# Patient Record
Sex: Female | Born: 1970 | Race: White | Hispanic: No | Marital: Married | Smoking: Current every day smoker
Health system: Southern US, Community
[De-identification: ages and names within clinical notes are randomized; demographics above are authoritative.]

## PROBLEM LIST (undated history)

## (undated) DIAGNOSIS — F419 Anxiety disorder, unspecified: Secondary | ICD-10-CM

## (undated) HISTORY — DX: Anxiety disorder, unspecified: F41.9

## (undated) HISTORY — PX: CHOLECYSTECTOMY: SHX55

## (undated) HISTORY — PX: ENDOMETRIAL ABLATION: SHX621

---

## 1998-02-05 ENCOUNTER — Other Ambulatory Visit: Admission: RE | Admit: 1998-02-05 | Discharge: 1998-02-05 | Payer: Self-pay | Admitting: Gynecology

## 1998-02-18 ENCOUNTER — Ambulatory Visit (HOSPITAL_COMMUNITY): Admission: AD | Admit: 1998-02-18 | Discharge: 1998-02-18 | Payer: Self-pay | Admitting: Gynecology

## 1998-10-16 ENCOUNTER — Other Ambulatory Visit: Admission: RE | Admit: 1998-10-16 | Discharge: 1998-10-16 | Payer: Self-pay | Admitting: Gynecology

## 1999-01-22 ENCOUNTER — Encounter: Admission: RE | Admit: 1999-01-22 | Discharge: 1999-04-22 | Payer: Self-pay | Admitting: Gynecology

## 1999-04-25 ENCOUNTER — Inpatient Hospital Stay (HOSPITAL_COMMUNITY): Admission: AD | Admit: 1999-04-25 | Discharge: 1999-04-25 | Payer: Self-pay | Admitting: Gynecology

## 1999-04-30 ENCOUNTER — Inpatient Hospital Stay (HOSPITAL_COMMUNITY): Admission: AD | Admit: 1999-04-30 | Discharge: 1999-04-30 | Payer: Self-pay | Admitting: Gynecology

## 1999-05-10 ENCOUNTER — Inpatient Hospital Stay (HOSPITAL_COMMUNITY): Admission: AD | Admit: 1999-05-10 | Discharge: 1999-05-12 | Payer: Self-pay | Admitting: Gynecology

## 1999-06-14 ENCOUNTER — Other Ambulatory Visit: Admission: RE | Admit: 1999-06-14 | Discharge: 1999-06-14 | Payer: Self-pay | Admitting: Gynecology

## 2001-01-28 ENCOUNTER — Other Ambulatory Visit: Admission: RE | Admit: 2001-01-28 | Discharge: 2001-01-28 | Payer: Self-pay | Admitting: Gynecology

## 2002-01-20 ENCOUNTER — Encounter: Payer: Self-pay | Admitting: Emergency Medicine

## 2002-01-20 ENCOUNTER — Emergency Department (HOSPITAL_COMMUNITY): Admission: EM | Admit: 2002-01-20 | Discharge: 2002-01-20 | Payer: Self-pay | Admitting: Emergency Medicine

## 2002-09-14 ENCOUNTER — Other Ambulatory Visit: Admission: RE | Admit: 2002-09-14 | Discharge: 2002-09-14 | Payer: Self-pay | Admitting: *Deleted

## 2005-02-12 ENCOUNTER — Other Ambulatory Visit: Admission: RE | Admit: 2005-02-12 | Discharge: 2005-02-12 | Payer: Self-pay | Admitting: Obstetrics and Gynecology

## 2017-03-16 ENCOUNTER — Emergency Department (HOSPITAL_COMMUNITY)
Admission: EM | Admit: 2017-03-16 | Discharge: 2017-03-17 | Disposition: A | Discharge status: Home / Self Care | Payer: 59 | Attending: Emergency Medicine | Admitting: Emergency Medicine

## 2017-03-16 ENCOUNTER — Encounter (HOSPITAL_COMMUNITY): Payer: Self-pay

## 2017-03-16 DIAGNOSIS — R42 Dizziness and giddiness: Secondary | ICD-10-CM | POA: Diagnosis not present

## 2017-03-16 DIAGNOSIS — R109 Unspecified abdominal pain: Secondary | ICD-10-CM | POA: Diagnosis not present

## 2017-03-16 DIAGNOSIS — M545 Low back pain, unspecified: Secondary | ICD-10-CM

## 2017-03-16 DIAGNOSIS — R112 Nausea with vomiting, unspecified: Secondary | ICD-10-CM | POA: Insufficient documentation

## 2017-03-16 DIAGNOSIS — R51 Headache: Secondary | ICD-10-CM | POA: Insufficient documentation

## 2017-03-16 DIAGNOSIS — R197 Diarrhea, unspecified: Secondary | ICD-10-CM | POA: Diagnosis not present

## 2017-03-16 DIAGNOSIS — R350 Frequency of micturition: Secondary | ICD-10-CM | POA: Diagnosis not present

## 2017-03-16 LAB — URINALYSIS, ROUTINE W REFLEX MICROSCOPIC
BILIRUBIN URINE: NEGATIVE
Glucose, UA: NEGATIVE mg/dL
KETONES UR: 5 mg/dL — AB
LEUKOCYTES UA: NEGATIVE
NITRITE: NEGATIVE
Protein, ur: NEGATIVE mg/dL
SPECIFIC GRAVITY, URINE: 1.016 (ref 1.005–1.030)
pH: 5 (ref 5.0–8.0)

## 2017-03-16 NOTE — ED Triage Notes (Signed)
Pt complains of flank pain for one week, she has been treated for a UTI by her primary doctor, Friday she started to vomit and her skin was red and burning, she hasn't had her antibiotic in the last two doses

## 2017-03-16 NOTE — ED Provider Notes (Signed)
TIME SEEN: 11:57 PM  CHIEF COMPLAINT: "I think I have a urinary tract infection"  HPI: Patient is a 46 year old 72female with no significant past medical history who presents to the emergency department with complaints of lower back pain and urinary frequency that has been present for almost 2 weeks.  Was seen in urgent care and had a normal urinalysis in the positive urine culture.  Was started on Bactrim for 3 days.  Symptoms improved but did not resolve.  Was seen again and started back on Bactrim for another 10 days.  Reports over the weekend she developed itchy skin without rash, felt hot and lightheaded, had mild headache, felt nauseated and had some vomiting, felt flushed.  She stopped taking these antibiotics 2 days ago.  Denies any fevers, abdominal pain.  Has had diarrhea.  Still complaining of lower back pain that goes up into her flank.  No injury to her back.  Pain is worse with movement.  No numbness, tingling or focal weakness.  No bowel or bladder incontinence.  No history of kidney stone.  States she was told by the urgent care provider that she had a "touch of sepsis".  She denies any blood work was drawn at the urgent care facility.  ROS: See HPI Constitutional: no fever  Eyes: no drainage  ENT: no runny nose   Cardiovascular:  no chest pain  Resp: no SOB  GI: Diarrhea and intermittent vomiting GU: no dysuria Integumentary: no rash  Allergy: no hives  Musculoskeletal: no leg swelling  Neurological: no slurred speech ROS otherwise negative  PAST MEDICAL HISTORY/PAST SURGICAL HISTORY:  History reviewed. No pertinent past medical history.  MEDICATIONS:  Prior to Admission medications   Medication Sig Start Date End Date Taking? Authorizing Provider  ibuprofen (ADVIL,MOTRIN) 200 MG tablet Take 400 mg by mouth every 6 (six) hours as needed for moderate pain.   Yes [provider]  sulfamethoxazole-trimethoprim (BACTRIM DS,SEPTRA DS) 800-160 MG tablet Take 1 tablet by  mouth 2 (two) times daily. for 10 days 03/11/17  Yes [provider]    ALLERGIES:  No Known Allergies  SOCIAL HISTORY:  Social History  Substance Use Topics  . Smoking status: Never Smoker  . Smokeless tobacco: Never Used  . Alcohol use No    FAMILY HISTORY: History reviewed. No pertinent family history.  EXAM: BP 129/86 (BP Location: Left Arm)   Pulse 75   Temp 98 F (36.7 C) (Oral)   Resp 16   Ht 5\' 1"  (1.549 m)   Wt 79.2 kg (174 lb 11.2 oz)   SpO2 100%   BMI 33.01 kg/m  CONSTITUTIONAL: Alert and oriented and responds appropriately to questions. Well-appearing; well-nourished HEAD: Normocephalic EYES: Conjunctivae clear, pupils appear equal, EOMI ENT: normal nose; moist mucous membranes; No pharyngeal erythema or petechiae, no tonsillar hypertrophy or exudate, no uvular deviation, no unilateral swelling, no trismus or drooling, no muffled voice, normal phonation, no stridor, no dental caries present, no drainable dental abscess noted, no Ludwig's angina, tongue sits flat in the bottom of the mouth, no angioedema, no facial erythema or warmth, no facial swelling; no pain with movement of the neck. NECK: Supple, no meningismus, no nuchal rigidity, no LAD  CARD: RRR; S1 and S2 appreciated; no murmurs, no clicks, no rubs, no gallops RESP: Normal chest excursion without splinting or tachypnea; breath sounds clear and equal bilaterally; no wheezes, no rhonchi, no rales, no hypoxia or respiratory distress, speaking full sentences ABD/GI: Normal bowel sounds; non-distended; soft,  non-tender, no rebound, no guarding, no peritoneal signs, no hepatosplenomegaly BACK:  The back appears normal and is non-tender to palpation, there is no CVA tenderness, patient points to the lower lumbar spine as the source of her pain bilaterally, no midline step-off or deformity EXT: Normal ROM in all joints; non-tender to palpation; no edema; normal capillary refill; no cyanosis, no calf  tenderness or swelling    SKIN: Normal color for age and race; warm; no rash NEURO: Moves all extremities equally, sensation to light touch intact diffusely, normal speech, normal gait, no saddle anesthesia PSYCH: The patient's mood and manner are appropriate. Grooming and personal hygiene are appropriate.  MEDICAL DECISION MAKING: Patient here with complaints of back pain that goes into the flank.  Also having some urinary frequency.  Urine here shows no obvious sign of infection but will send a urine culture.  Patient seems to have musculoskeletal back pain but this also could be kidney stone, pyelonephritis.  States she was told she had "a touch of sepsis".  I am not sure what this means.  Her vital signs today are normal.  She is afebrile and has not had any antipyretics today.  Will check labs, lactate.  Will give IV fluids, Toradol.  She reports nausea has improved.  I suspect some of her symptoms over the weekend may have been an adverse reaction to Bactrim.  I recommended she stop this medication.  ED PROGRESS: Patient's workup is unremarkable.  No leukocytosis.  Normal lactate.  Normal LFTs and lipase.  CT scan shows complex renal cyst.  Ultrasound recommended but I feel this can be done as an outpatient.  No kidney stones or signs of pyelonephritis.  Urine culture has been sent.  I suspect this is more musculoskeletal pain.  Recommended holding Bactrim and alternating Tylenol and Motrin for pain.  She has an appointment for follow-up with her primary care physician at 9:30 AM today.   At this time, I do not feel there is any life-threatening condition present. I have reviewed and discussed all results (EKG, imaging, lab, urine as appropriate) and exam findings with patient/family. I have reviewed nursing notes and appropriate previous records.  I feel the patient is safe to be discharged home without further emergent workup and can continue workup as an outpatient as needed. Discussed usual and  customary return precautions. Patient/family verbalize understanding and are comfortable with this plan.  Outpatient follow-up has been provided if needed. All questions have been answered.      Alaija Ruble, Layla Maw, DO 03/17/17 0120

## 2017-03-17 ENCOUNTER — Emergency Department (HOSPITAL_COMMUNITY): Payer: 59

## 2017-03-17 LAB — CBC WITH DIFFERENTIAL/PLATELET
Basophils Absolute: 0.1 10*3/uL (ref 0.0–0.1)
Basophils Relative: 1 %
EOS PCT: 2 %
Eosinophils Absolute: 0.2 10*3/uL (ref 0.0–0.7)
HCT: 46.7 % — ABNORMAL HIGH (ref 36.0–46.0)
HEMOGLOBIN: 16.7 g/dL — AB (ref 12.0–15.0)
LYMPHS ABS: 3.7 10*3/uL (ref 0.7–4.0)
Lymphocytes Relative: 45 %
MCH: 31.5 pg (ref 26.0–34.0)
MCHC: 35.8 g/dL (ref 30.0–36.0)
MCV: 87.9 fL (ref 78.0–100.0)
MONOS PCT: 6 %
Monocytes Absolute: 0.5 10*3/uL (ref 0.1–1.0)
NEUTROS PCT: 46 %
Neutro Abs: 3.8 10*3/uL (ref 1.7–7.7)
Platelets: 173 10*3/uL (ref 150–400)
RBC: 5.31 MIL/uL — ABNORMAL HIGH (ref 3.87–5.11)
RDW: 12.9 % (ref 11.5–15.5)
WBC: 8.2 10*3/uL (ref 4.0–10.5)

## 2017-03-17 LAB — COMPREHENSIVE METABOLIC PANEL
ALK PHOS: 64 U/L (ref 38–126)
ALT: 23 U/L (ref 14–54)
ANION GAP: 8 (ref 5–15)
AST: 19 U/L (ref 15–41)
Albumin: 4.7 g/dL (ref 3.5–5.0)
BUN: 14 mg/dL (ref 6–20)
CALCIUM: 9.4 mg/dL (ref 8.9–10.3)
CO2: 22 mmol/L (ref 22–32)
Chloride: 107 mmol/L (ref 101–111)
Creatinine, Ser: 1 mg/dL (ref 0.44–1.00)
Glucose, Bld: 100 mg/dL — ABNORMAL HIGH (ref 65–99)
Potassium: 3.9 mmol/L (ref 3.5–5.1)
SODIUM: 137 mmol/L (ref 135–145)
TOTAL PROTEIN: 7.7 g/dL (ref 6.5–8.1)
Total Bilirubin: 0.7 mg/dL (ref 0.3–1.2)

## 2017-03-17 LAB — I-STAT CG4 LACTIC ACID, ED: LACTIC ACID, VENOUS: 1.08 mmol/L (ref 0.5–1.9)

## 2017-03-17 LAB — LIPASE, BLOOD: LIPASE: 30 U/L (ref 11–51)

## 2017-03-17 MED ORDER — SODIUM CHLORIDE 0.9 % IV BOLUS (SEPSIS)
1000.0000 mL | Freq: Once | INTRAVENOUS | Status: AC
  Administered 2017-03-17: 1000 mL via INTRAVENOUS

## 2017-03-17 MED ORDER — KETOROLAC TROMETHAMINE 30 MG/ML IJ SOLN
30.0000 mg | Freq: Once | INTRAMUSCULAR | Status: AC
  Administered 2017-03-17: 30 mg via INTRAVENOUS
  Filled 2017-03-17: qty 1

## 2017-03-17 NOTE — Discharge Instructions (Signed)
Your blood work, urine, CT scan were reassuring today.  You had what appeared to be cyst on your kidneys which would not cause you to have any pain but radiology has recommended further evaluation with an ultrasound.  This can be ordered by her primary care doctor as an outpatient.  I suspect that this is musculoskeletal back pain.  May alternate Tylenol and Motrin as needed for pain.  You may apply heat to this area.  Please do not lift anything over 20 pounds.  Please no strenuous activity for the next week.   You may alternate Tylenol 1000 mg every 6 hours as needed for pain and Ibuprofen 800 mg every 8 hours as needed for pain.  Please take Ibuprofen with food.

## 2017-03-17 NOTE — ED Notes (Signed)
Patient transported to X-ray 

## 2017-03-18 LAB — URINE CULTURE

## 2018-06-18 IMAGING — CT CT RENAL STONE PROTOCOL
2 of 3 series · 16 of 46 positions shown, 18 images · non-contrast
Comparison: Abdominal CT dated 07/31/2008

CLINICAL DATA: 46-year-old female with flank pain. Concern for
kidney stone.

EXAM:
CT ABDOMEN AND PELVIS WITHOUT CONTRAST
TECHNIQUE: Multidetector CT imaging of the abdomen and pelvis was performed
following the standard protocol without IV contrast.

[Series 3: lung · axial · 0.67mm/px · z∈[-373,-309]mm · 13 of 38 slices shown, 15 images]
[im 3/38  soft-tissue]
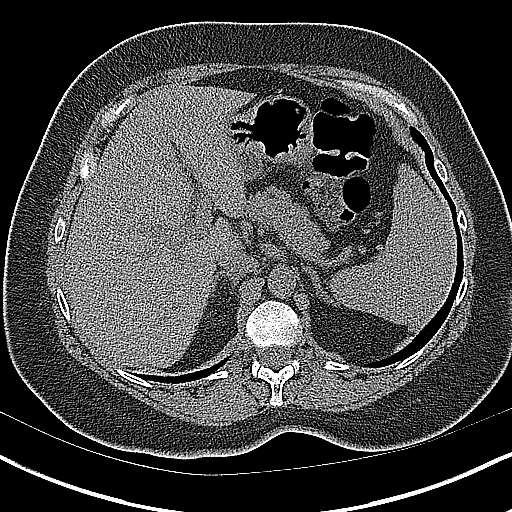
[im 3/38  bone]
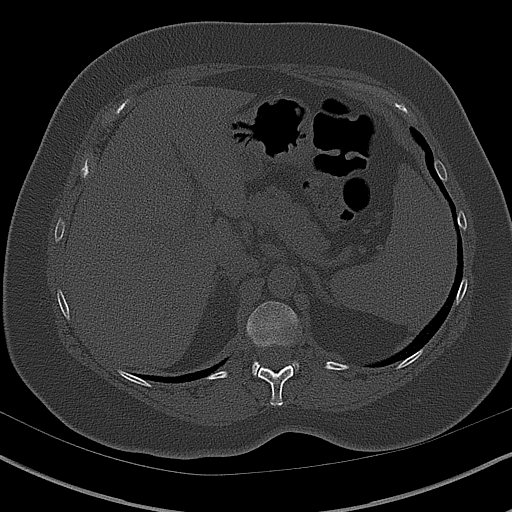
[im 5/38  soft-tissue]
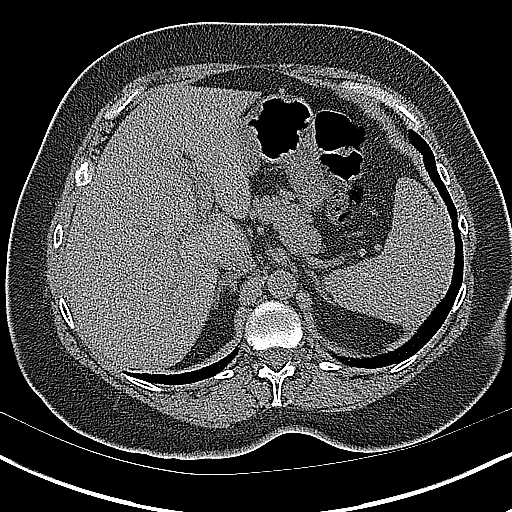
[im 8/38  soft-tissue]
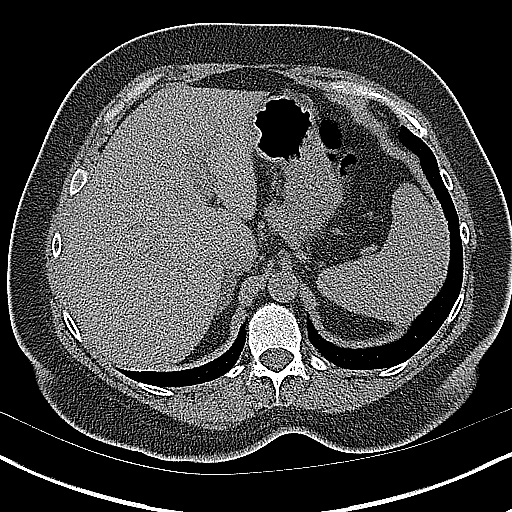
[im 11/38  soft-tissue]
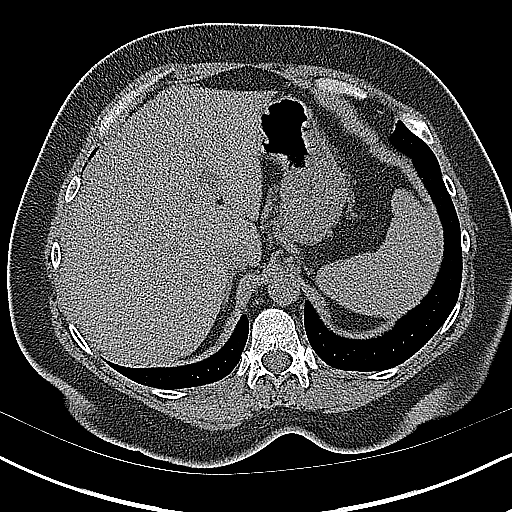
[im 14/38  soft-tissue]
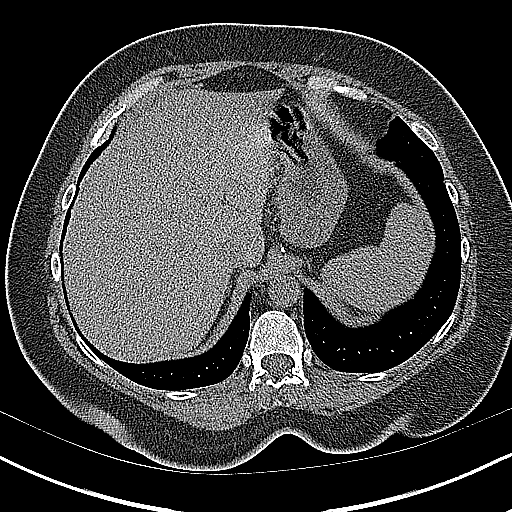
[im 16/38  soft-tissue]
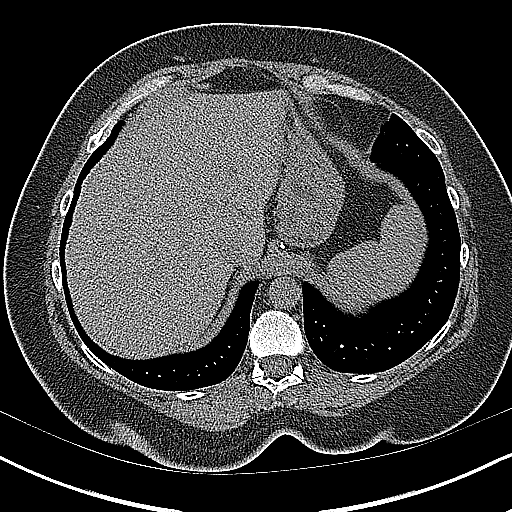
[im 20/38  soft-tissue]
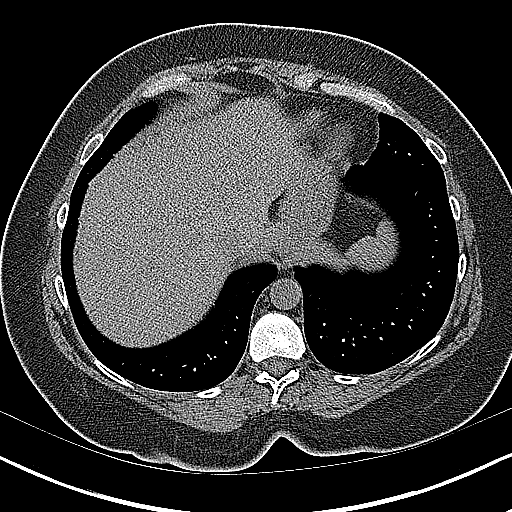
[im 22/38  soft-tissue]
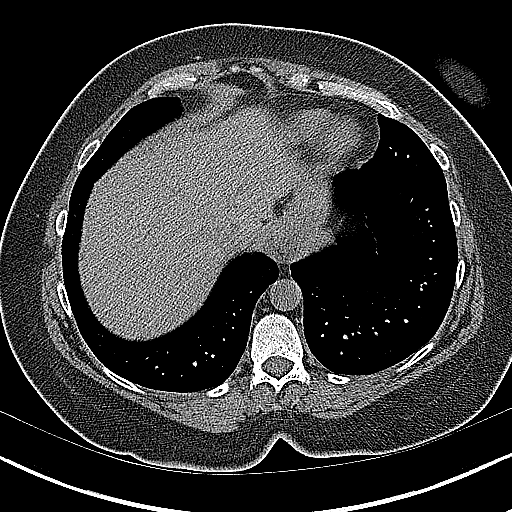
[im 24/38  soft-tissue]
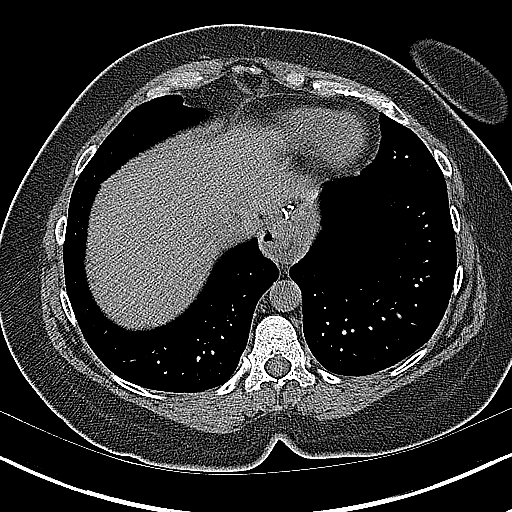
[im 24/38  bone]
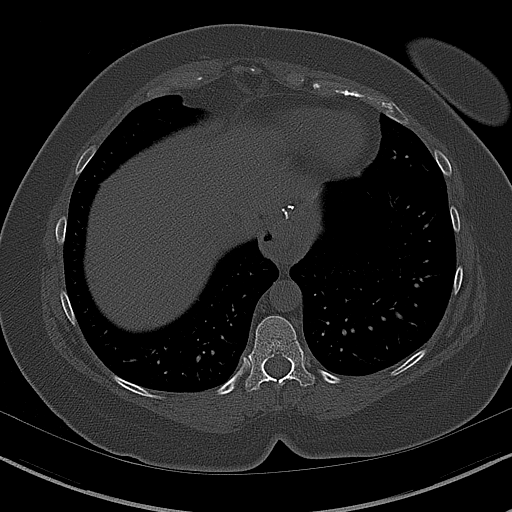
[im 27/38  soft-tissue]
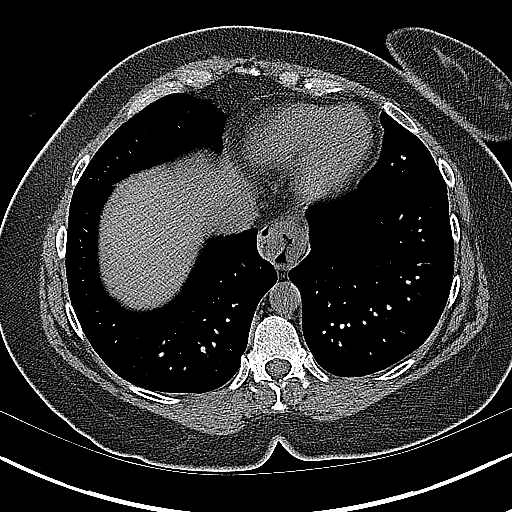
[im 30/38  soft-tissue]
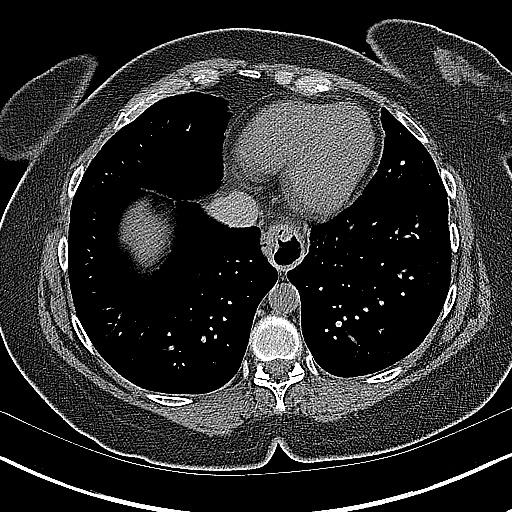
[im 33/38  soft-tissue]
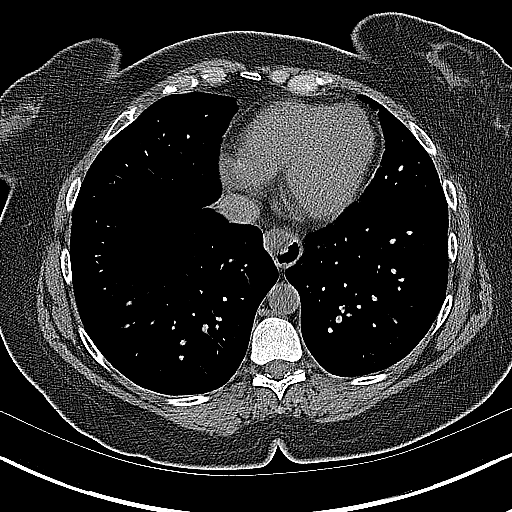
[im 35/38  soft-tissue]
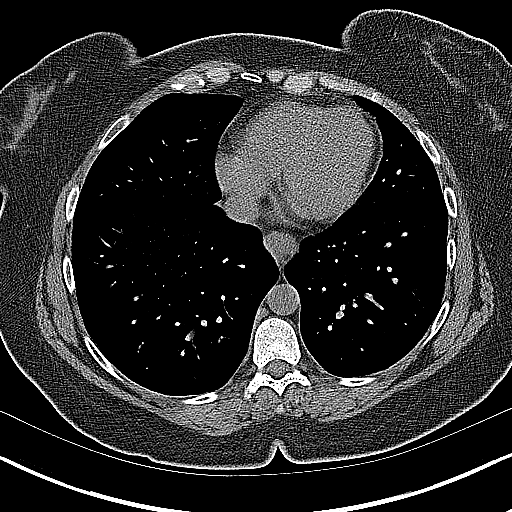

[Series 4: coronal · coronal · 0.69mm/px · 3 of 138 slices shown]
[im 46/138  soft-tissue]
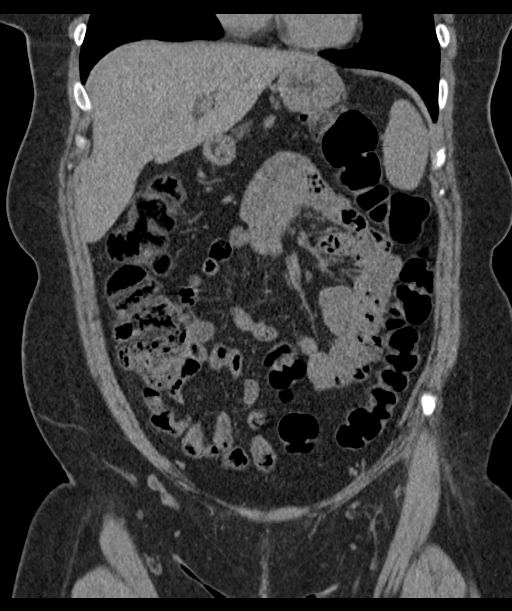
[im 61/138  soft-tissue]
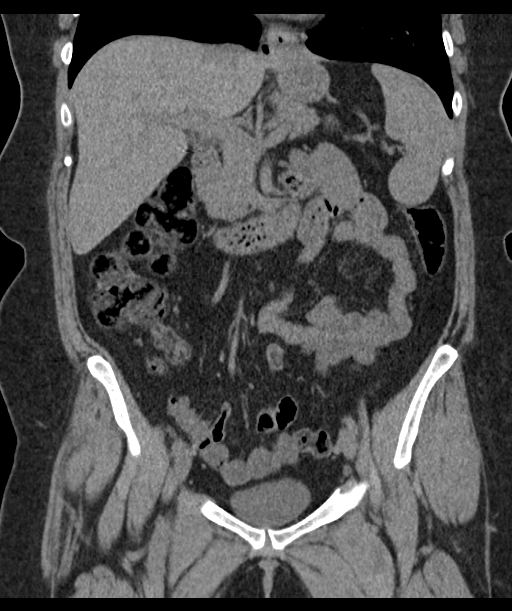
[im 77/138  soft-tissue]
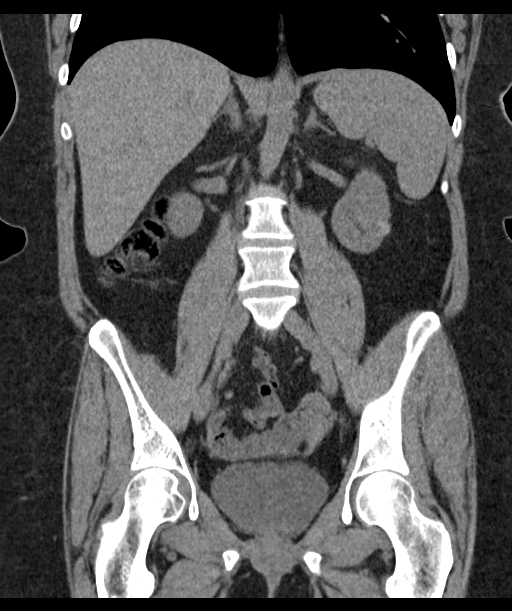

[16 of 46 positions shown; findings below may reference images not displayed]

FINDINGS: Evaluation of this exam is limited in the absence of intravenous
contrast.

Lower chest: The visualized lung bases are clear.

No intra-abdominal free air or free fluid.

Hepatobiliary: Cholecystectomy. The liver is unremarkable. No
intrahepatic biliary ductal dilatation.

Pancreas: Unremarkable. No pancreatic ductal dilatation or
surrounding inflammatory changes.

Spleen: Normal in size without focal abnormality.

Adrenals/Urinary Tract: The adrenal glands are unremarkable. There
is a subcentimeter hyperdense lesion in the anterior aspect of the
inferior pole of the left kidney (series 2, image 30 and series 4,
image 78). This lesion appears to correspond to the lesion seen on
the CT of 07/31/2008 and likely represents a proteinaceous cyst.
However, this is not characterized on this unenhanced CT and further
evaluation with ultrasound is recommended. In addition there is a 15
mm low attenuating lesion in the posterior interpolar left kidney.
There is no hydronephrosis or nephrolithiasis on either side. The
visualized ureters and urinary bladder appear unremarkable.

Stomach/Bowel: There is no bowel obstruction or active inflammation.
There is a small hiatal hernia. Multiple hernia repair clips noted
at the GE junction. The appendix is normal.

Vascular/Lymphatic: Mild aortoiliac atherosclerotic disease. No
portal venous gas. There is no adenopathy.

Reproductive: The uterus is retroflexed. The ovaries are grossly
unremarkable.

Other: None

Musculoskeletal: There are bilateral L5 pars defects with grade 1
L5-S1 anterolisthesis. No acute osseous pathology.
IMPRESSION: 1. No acute intra-abdominopelvic pathology. No hydronephrosis or
nephrolithiasis.
2. Left renal lesions, indeterminate and not well characterized on
this unenhanced CT but likely represent complex cysts. Further
evaluation with ultrasound recommended.
3. No bowel obstruction or active inflammation.  Normal appendix.

## 2019-07-28 ENCOUNTER — Ambulatory Visit (INDEPENDENT_AMBULATORY_CARE_PROVIDER_SITE_OTHER): Payer: PRIVATE HEALTH INSURANCE

## 2019-07-28 ENCOUNTER — Ambulatory Visit (INDEPENDENT_AMBULATORY_CARE_PROVIDER_SITE_OTHER): Payer: PRIVATE HEALTH INSURANCE | Admitting: Sports Medicine

## 2019-07-28 ENCOUNTER — Other Ambulatory Visit: Payer: Self-pay

## 2019-07-28 ENCOUNTER — Other Ambulatory Visit: Payer: Self-pay | Admitting: Sports Medicine

## 2019-07-28 ENCOUNTER — Encounter: Payer: Self-pay | Admitting: Sports Medicine

## 2019-07-28 DIAGNOSIS — G5751 Tarsal tunnel syndrome, right lower limb: Secondary | ICD-10-CM | POA: Diagnosis not present

## 2019-07-28 DIAGNOSIS — M7752 Other enthesopathy of left foot: Secondary | ICD-10-CM

## 2019-07-28 DIAGNOSIS — M722 Plantar fascial fibromatosis: Secondary | ICD-10-CM

## 2019-07-28 DIAGNOSIS — M79671 Pain in right foot: Secondary | ICD-10-CM

## 2019-07-28 DIAGNOSIS — M79604 Pain in right leg: Secondary | ICD-10-CM | POA: Diagnosis not present

## 2019-07-28 DIAGNOSIS — M7741 Metatarsalgia, right foot: Secondary | ICD-10-CM | POA: Diagnosis not present

## 2019-07-28 DIAGNOSIS — M779 Enthesopathy, unspecified: Secondary | ICD-10-CM

## 2019-07-28 MED ORDER — TRIAMCINOLONE ACETONIDE 10 MG/ML IJ SUSP
10.0000 mg | Freq: Once | INTRAMUSCULAR | Status: AC
  Administered 2019-07-28: 20:00:00 10 mg

## 2019-07-28 MED ORDER — MELOXICAM 15 MG PO TABS: 15.0000 mg | ORAL_TABLET | Freq: Every day | ORAL | 0 refills | Status: DC

## 2019-07-28 NOTE — Patient Instructions (Signed)
For tennis shoes recommend:  Brooks Beast Ascis New balance Saucony Can be purchased at [employer] or [employer]  Vionic  SAS Can be purchased at [employer] or [employer]   For work shoes recommend: Sketchers Work Timberland boots  Can be purchased at a variety of places or [employer]   For casual shoes recommend: Vionic  Can be purchased at [employer] or [employer]   For Over the Counter Orthotics recommend: Power Steps Can be purchased in office/[facility] Superfeet Can be purchased at [employer] or [employer] Airplus Can be purchased at [employer]  

## 2019-07-28 NOTE — Progress Notes (Signed)
Subjective: Kathy Christian is a 49 y.o. female patient presents to office with complaint of moderate pain on the right foot at the ball and lateral foot and heel for the last year burning and achy in nature and feels swollen but does not look it, pain 7-8/10. Has tried icing, motrin, and resting without improving. Denies any other pedal complaints.   Review of Systems  All other systems reviewed and are negative.   There are no problems to display for this patient.   No current outpatient medications on file prior to visit.   No current facility-administered medications on file prior to visit.    No Known Allergies  Objective: Physical Exam General: The patient is alert and oriented x3 in no acute distress.  Dermatology: Skin is warm, dry and supple bilateral lower extremities. Nails 1-10 are normal. There is no erythema, edema, no eccymosis, no open lesions present. Integument is otherwise unremarkable.  Vascular: Dorsalis Pedis pulse and Posterior Tibial pulse are 2/4 bilateral. Capillary fill time is immediate to all digits.  Neurological: Grossly intact to light touch with an achilles reflex of +2/5 and + Tinel's sign on right.  Musculoskeletal: Tenderness to palpation at the medial calcaneal tubercale and through the insertion of the plantar fascia on the right foot>PT nerve distrubution>lesser MTPJ> lateral ankle. No pain with compression of calcaneus bilateral. No pain with tuning fork to calcaneus bilateral. No pain with calf compression bilateral. There is decreased Ankle joint range of motion bilateral. All other joints range of motion within normal limits bilateral. +Pes planus. Strength 5/5 in all groups bilateral.   Gait: Unassisted, Antalgic avoid weight on right  Xray, Right foot:  Normal osseous mineralization. Joint spaces preserved. No fracture/dislocation/boney destruction. Calcaneal spur present with mild thickening of plantar fascia. No other soft tissue  abnormalities or radiopaque foreign bodies.   Assessment and Plan: Problem List Items Addressed This Visit    None    Visit Diagnoses    Tarsal tunnel syndrome of right side    -  Primary   Relevant Medications   triamcinolone acetonide (KENALOG) 10 MG/ML injection 10 mg (Completed)   Plantar fasciitis, right       Relevant Medications   triamcinolone acetonide (KENALOG) 10 MG/ML injection 10 mg (Completed)   Metatarsalgia of right foot       Pain of right lower extremity       Tendonitis           -Complete examination performed.  -Xrays reviewed -Discussed with patient in detail the condition of tarsal tunnel with plantar fasciitis and secondary compensation metatarsalgia and lateral ankle pain, how this occurs and general treatment options. Explained both conservative and surgical treatments.  -After oral consent and aseptic prep, injected a mixture containing 1 ml of 2%  plain lidocaine, 1 ml 0.5% plain marcaine, 0.5 ml of kenalog 10 and 0.5 ml of dexamethasone phosphate into right heel at glabrous junction. Post-injection care discussed with patient.  -Rx Meloxicam -Recommended good supportive shoes and advised use of OTC insert. - Explained in detail the use of the fascial brace for right which was dispensed at today's visit. -Explained and dispensed to patient daily stretching exercises. -Recommend patient to ice affected area 1-2x daily. -Patient to return to office in 4 weeks for follow up or sooner if problems or questions arise.  Asencion Islam, DPM

## 2019-08-10 ENCOUNTER — Other Ambulatory Visit: Payer: Self-pay | Admitting: Sports Medicine

## 2019-08-10 DIAGNOSIS — M722 Plantar fascial fibromatosis: Secondary | ICD-10-CM

## 2019-08-19 ENCOUNTER — Other Ambulatory Visit: Payer: Self-pay | Admitting: Sports Medicine

## 2019-08-22 NOTE — Telephone Encounter (Signed)
Dr. Stover please advice 

## 2019-09-02 ENCOUNTER — Other Ambulatory Visit: Payer: Self-pay

## 2019-09-02 ENCOUNTER — Ambulatory Visit: Payer: PRIVATE HEALTH INSURANCE | Admitting: Sports Medicine

## 2019-09-02 DIAGNOSIS — M722 Plantar fascial fibromatosis: Secondary | ICD-10-CM

## 2019-09-02 DIAGNOSIS — G5751 Tarsal tunnel syndrome, right lower limb: Secondary | ICD-10-CM | POA: Diagnosis not present

## 2019-09-02 DIAGNOSIS — M79604 Pain in right leg: Secondary | ICD-10-CM

## 2019-09-02 DIAGNOSIS — M7741 Metatarsalgia, right foot: Secondary | ICD-10-CM

## 2019-09-03 ENCOUNTER — Encounter: Payer: Self-pay | Admitting: Sports Medicine

## 2019-09-03 DIAGNOSIS — M722 Plantar fascial fibromatosis: Secondary | ICD-10-CM

## 2019-09-03 DIAGNOSIS — G5751 Tarsal tunnel syndrome, right lower limb: Secondary | ICD-10-CM

## 2019-09-03 MED ORDER — TRIAMCINOLONE ACETONIDE 10 MG/ML IJ SUSP
10.0000 mg | Freq: Once | INTRAMUSCULAR | Status: AC
  Administered 2019-09-03: 10 mg

## 2019-09-03 NOTE — Progress Notes (Signed)
Subjective: Kathy Christian is a 49 y.o. female patient returns to office for follow up Right foot pain, reports that pain is a little better now 3-4/10 and feels better with injection and feels like the brace is helping, still has some days where foot swelling and there is burning or tingling and still a little pain on the ball of foot, OTC insoles/cushions help. No other issues noted.   There are no problems to display for this patient.   Current Outpatient Medications on File Prior to Visit  Medication Sig Dispense Refill  . meloxicam (MOBIC) 15 MG tablet TAKE 1 TABLET BY MOUTH EVERY DAY 30 tablet 0  . omeprazole (PRILOSEC) 40 MG capsule Take 40 mg by mouth daily.     No current facility-administered medications on file prior to visit.    No Known Allergies  Objective: Physical Exam General: The patient is alert and oriented x3 in no acute distress.  Dermatology: Skin is warm, dry and supple bilateral lower extremities. Nails 1-10 are normal. There is no erythema, edema, no eccymosis, no open lesions present. Integument is otherwise unremarkable.  Vascular: Dorsalis Pedis pulse and Posterior Tibial pulse are 2/4 bilateral. Capillary fill time is immediate to all digits.  Neurological: Grossly intact to light touch with an achilles reflex of +2/5 and + Tinel's sign on right.  Musculoskeletal: Tenderness to palpation at the medial calcaneal tubercale and through the insertion of the plantar fascia on the right foot>PT nerve distrubution>lesser MTPJ> lateral ankle with all pain sites doing better than last visit. No pain with compression of calcaneus bilateral. No pain with tuning fork to calcaneus bilateral. No pain with calf compression bilateral. There is decreased Ankle joint range of motion bilateral. All other joints range of motion within normal limits bilateral. +Pes planus. Strength 5/5 in all groups bilateral.   Assessment and Plan: Problem List Items Addressed This Visit    None    Visit Diagnoses    Tarsal tunnel syndrome of right side    -  Primary   Plantar fasciitis, right       Metatarsalgia of right foot       Pain of right lower extremity         -Complete examination performed.  -Previous Xrays reviewed -RE-Discussed with patient in detail the condition of tarsal tunnel with plantar fasciitis and secondary compensation metatarsalgia and ankle pain, how this occurs and general treatment options. Explained both conservative and surgical treatments.  -After oral consent and aseptic prep, injected a mixture containing 1 ml of 2%  plain lidocaine, 1 ml 0.5% plain marcaine, 0.5 ml of kenalog 10 and 0.5 ml of dexamethasone phosphate into right heel at glabrous junction and fanned some of the medication at the PT nerve distrubution as well. THIS IS INJECTION #2 TO THIS SITE. Post-injection care discussed with patient.  -Recommended continue with fascial brace and good supportive shoes and advised use of OTC insert like before - Continue with daily stretching and icing -Patient to return to office in 4 weeks for follow up or sooner if problems or questions arise.  Asencion Islam, DPM

## 2019-10-06 ENCOUNTER — Other Ambulatory Visit: Payer: Self-pay

## 2019-10-06 ENCOUNTER — Encounter: Payer: Self-pay | Admitting: Sports Medicine

## 2019-10-06 ENCOUNTER — Ambulatory Visit: Payer: PRIVATE HEALTH INSURANCE | Admitting: Sports Medicine

## 2019-10-06 DIAGNOSIS — G5751 Tarsal tunnel syndrome, right lower limb: Secondary | ICD-10-CM | POA: Diagnosis not present

## 2019-10-06 DIAGNOSIS — M79604 Pain in right leg: Secondary | ICD-10-CM

## 2019-10-06 DIAGNOSIS — M779 Enthesopathy, unspecified: Secondary | ICD-10-CM

## 2019-10-06 DIAGNOSIS — M722 Plantar fascial fibromatosis: Secondary | ICD-10-CM

## 2019-10-06 DIAGNOSIS — M7741 Metatarsalgia, right foot: Secondary | ICD-10-CM

## 2019-10-06 MED ORDER — TRIAMCINOLONE ACETONIDE 10 MG/ML IJ SUSP
10.0000 mg | Freq: Once | INTRAMUSCULAR | Status: AC
  Administered 2019-10-06: 10 mg

## 2019-10-06 NOTE — Progress Notes (Signed)
Subjective: Kathy Christian is a 49 y.o. female patient returns to office for follow up Right foot pain, reports that pain is doing much better and that the second injection really hurt reports that it is occasionally sore at most 3-4 out of 10 has been using plantar fascial brace inserts stretching icing and reports that swelling is much better. No other issues noted.   There are no problems to display for this patient.   Current Outpatient Medications on File Prior to Visit  Medication Sig Dispense Refill  . meloxicam (MOBIC) 15 MG tablet TAKE 1 TABLET BY MOUTH EVERY DAY 30 tablet 0  . omeprazole (PRILOSEC) 40 MG capsule Take 40 mg by mouth daily.     No current facility-administered medications on file prior to visit.    No Known Allergies  Objective: Physical Exam General: The patient is alert and oriented x3 in no acute distress.  Dermatology: Skin is warm, dry and supple bilateral lower extremities. Nails 1-10 are normal. There is no erythema, edema, no eccymosis, no open lesions present. Integument is otherwise unremarkable.  Vascular: Dorsalis Pedis pulse and Posterior Tibial pulse are 2/4 bilateral. Capillary fill time is immediate to all digits.  Neurological: Grossly intact to light touch with an achilles reflex of +2/5 and + Tinel's sign on right.  Musculoskeletal: There is mild residual tenderness to palpation at the medial calcaneal tubercale and through the insertion of the plantar fascia on the right foot>PT nerve distrubution>lesser MTPJ> lateral ankle with all pain sites doing better than last visit. No pain with compression of calcaneus bilateral. No pain with tuning fork to calcaneus bilateral. No pain with calf compression bilateral. There is decreased Ankle joint range of motion bilateral. All other joints range of motion within normal limits bilateral. +Pes planus. Strength 5/5 in all groups bilateral.   Assessment and Plan: Problem List Items Addressed This Visit     None    Visit Diagnoses    Tarsal tunnel syndrome of right side    -  Primary   Plantar fasciitis, right       Metatarsalgia of right foot       Pain of right lower extremity       Tendonitis         -Complete examination performed.  -Discussed again with patient in detail the condition of tarsal tunnel with plantar fasciitis and secondary compensation metatarsalgia and ankle pain, how this occurs and general treatment options. Explained both conservative and surgical treatments.  -After oral consent and aseptic prep, injected a mixture containing 1 ml of 2%  plain lidocaine, 1 ml 0.5% plain marcaine, 0.5 ml of kenalog 10 and 0.5 ml of dexamethasone phosphate into right heel at glabrous junction and fanned some of the medication at the PT nerve distrubution as well. THIS IS INJECTION #3 TO THIS SITE. Post-injection care discussed with patient.  -Recommended continue with fascial brace and good supportive shoes and advised use of OTC insert like before - Continue with daily stretching and icing like before -Advised patient if pain is still present after 2 weeks we should add on physical therapy -Patient to return to call office for prescription for therapy if pain is still present after 2 weeks or return in person if pain fails to continue to improve. Asencion Islam, DPM

## 2021-12-04 ENCOUNTER — Ambulatory Visit: Payer: No Typology Code available for payment source | Admitting: Cardiology

## 2022-01-02 ENCOUNTER — Ambulatory Visit: Payer: No Typology Code available for payment source | Admitting: Cardiology

## 2022-01-27 ENCOUNTER — Ambulatory Visit: Payer: No Typology Code available for payment source | Admitting: Cardiology

## 2022-02-07 ENCOUNTER — Encounter: Payer: Self-pay | Admitting: Cardiology

## 2022-02-07 ENCOUNTER — Ambulatory Visit: Payer: PRIVATE HEALTH INSURANCE | Attending: Cardiology | Admitting: Cardiology

## 2022-02-07 VITALS — BP 128/82 | HR 92 | Resp 18 | Ht 61.0 in | Wt 206.2 lb

## 2022-02-07 DIAGNOSIS — F17210 Nicotine dependence, cigarettes, uncomplicated: Secondary | ICD-10-CM

## 2022-02-07 DIAGNOSIS — R079 Chest pain, unspecified: Secondary | ICD-10-CM

## 2022-02-07 DIAGNOSIS — Z13220 Encounter for screening for lipoid disorders: Secondary | ICD-10-CM

## 2022-02-07 DIAGNOSIS — E669 Obesity, unspecified: Secondary | ICD-10-CM

## 2022-02-07 DIAGNOSIS — R011 Cardiac murmur, unspecified: Secondary | ICD-10-CM

## 2022-02-07 HISTORY — DX: Obesity, unspecified: E66.9

## 2022-02-07 HISTORY — DX: Cardiac murmur, unspecified: R01.1

## 2022-02-07 HISTORY — DX: Nicotine dependence, cigarettes, uncomplicated: F17.210

## 2022-02-07 HISTORY — DX: Chest pain, unspecified: R07.9

## 2022-02-07 MED ORDER — NITROGLYCERIN 0.4 MG SL SUBL: 0.4000 mg | SUBLINGUAL_TABLET | SUBLINGUAL | 6 refills | Status: DC | PRN

## 2022-02-07 NOTE — Addendum Note (Signed)
Addended by: Truddie Hidden on: 02/07/2022 03:59 PM   Modules accepted: Orders

## 2022-02-07 NOTE — Patient Instructions (Signed)
Medication Instructions:  Your physician has recommended you make the following change in your medication:   Use nitroglycerin 1 tablet placed under the tongue at the first sign of chest pain or an angina attack. 1 tablet may be used every 5 minutes as needed, for up to 15 minutes. Do not take more than 3 tablets in 15 minutes. If pain persist call 911 or go to the nearest ED.   *If you need a refill on your cardiac medications before your next appointment, please call your pharmacy*   Lab Work: Your physician recommends that you return for lab work in: the day of your stress test. You need to have labs done when you are fasting.  You can come Monday through Friday 8:30 am to 12:00 pm and 1:15 to 4:30. You do not need to make an appointment as the order has already been placed. The labs you are going to have done are BMET, LFT and Lipids.  If you have labs (blood work) drawn today and your tests are completely normal, you will receive your results only by: [place] (if you have MyChart) OR A paper copy in the mail If you have any lab test that is abnormal or we need to change your treatment, we will call you to review the results.   Testing/Procedures: Your physician has requested that you have a lexiscan myoview. For further information please visit [email or web address]. Please follow instruction sheet, as given.  The test will take approximately 3 to 4 hours to complete; you may bring reading material.  If someone comes with you to your appointment, they will need to remain in the main lobby due to limited space in the testing area. **If you are pregnant or breastfeeding, please notify the nuclear lab prior to your appointment**  How to prepare for your Myocardial Perfusion Test: Do not eat or drink after midnight prior to your test, except you may have water. We will be doing fasting labs that morning. Do not consume products containing caffeine (regular or decaffeinated) 12 hours  prior to your test. (ex: coffee, chocolate, sodas, tea). Do bring a list of your current medications with you.  If not listed below, you may take your medications as normal. Do wear comfortable clothes (no dresses or overalls) and walking shoes, tennis shoes preferred (No heels or open toe shoes are allowed). Do NOT wear cologne, perfume, aftershave, or lotions (deodorant is allowed). If these instructions are not followed, your test will have to be rescheduled.  Your physician has requested that you have an echocardiogram. Echocardiography is a painless test that uses sound waves to create images of your heart. It provides your doctor with information about the size and shape of your heart and how well your heart's chambers and valves are working. This procedure takes approximately one hour. There are no restrictions for this procedure.   Follow-Up: At [facility], you and your health needs are our priority.  As part of our continuing mission to provide you with exceptional heart care, we have created designated Provider Care Teams.  These Care Teams include your primary Cardiologist (physician) and Advanced Practice Providers (APPs -  Physician Assistants and Nurse Practitioners) who all work together to provide you with the care you need, when you need it.  We recommend signing up for the patient portal called "MyChart".  Sign up information is provided on this After Visit Summary.  MyChart is used to connect with patients for Virtual Visits (Telemedicine).  Patients are  able to view lab/test results, encounter notes, upcoming appointments, etc.  Non-urgent messages can be sent to your provider as well.   To learn more about what you can do with MyChart, go to [email or web address].    Your next appointment:   3 month(s)  The format for your next appointment:   In Person  Provider:   Belva Crome, MD   Other Instructions Cardiac Nuclear Scan A cardiac nuclear scan is a test that  is done to check the flow of blood to your heart. It is done when you are resting and when you are exercising. The test looks for problems such as: Not enough blood reaching a portion of the heart. The heart muscle not working as it should. You may need this test if: You have heart disease. You have had lab results that are not normal. You have had heart surgery or a balloon procedure to open up blocked arteries (angioplasty). You have chest pain. You have shortness of breath. In this test, a special dye (tracer) is put into your bloodstream. The tracer will travel to your heart. A camera will then take pictures of your heart to see how the tracer moves through your heart. This test is usually done at a hospital and takes 2-4 hours. Tell a doctor about: Any allergies you have. All medicines you are taking, including vitamins, herbs, eye drops, creams, and over-the-counter medicines. Any problems you or family members have had with anesthetic medicines. Any blood disorders you have. Any surgeries you have had. Any medical conditions you have. Whether you are pregnant or may be pregnant. What are the risks? Generally, this is a safe test. However, problems may occur, such as: Serious chest pain and heart attack. This is only a risk if the stress portion of the test is done. Rapid heartbeat. A feeling of warmth in your chest. This feeling usually does not last long. Allergic reaction to the tracer. What happens before the test? Ask your doctor about changing or stopping your normal medicines. This is important. Follow instructions from your doctor about what you cannot eat or drink. Remove your jewelry on the day of the test. What happens during the test? An IV tube will be inserted into one of your veins. Your doctor will give you a small amount of tracer through the IV tube. You will wait for 20-40 minutes while the tracer moves through your bloodstream. Your heart will be monitored  with an electrocardiogram (ECG). You will lie down on an exam table. Pictures of your heart will be taken for about 15-20 minutes. You may also have a stress test. For this test, one of these things may be done: You will be asked to exercise on a treadmill or a stationary bike. You will be given medicines that will make your heart work harder. This is done if you are unable to exercise. When blood flow to your heart has peaked, a tracer will again be given through the IV tube. After 20-40 minutes, you will get back on the exam table. More pictures will be taken of your heart. Depending on the tracer that is used, more pictures may need to be taken 3-4 hours later. Your IV tube will be removed when the test is over. The test may vary among doctors and hospitals. What happens after the test? Ask your doctor: Whether you can return to your normal schedule, including diet, activities, and medicines. Whether you should drink more fluids. This will help to remove the  tracer from your body. Drink enough fluid to keep your pee (urine) pale yellow. Ask your doctor, or the department that is doing the test: When will my results be ready? How will I get my results? Summary A cardiac nuclear scan is a test that is done to check the flow of blood to your heart. Tell your doctor whether you are pregnant or may be pregnant. Before the test, ask your doctor about changing or stopping your normal medicines. This is important. Ask your doctor whether you can return to your normal activities. You may be asked to drink more fluids. This information is not intended to replace advice given to you by your health care provider. Make sure you discuss any questions you have with your health care provider. Document Revised: 08/25/2018 Document Reviewed: 10/19/2017 Elsevier Patient Education  2021 [employer].    Echocardiogram An echocardiogram is a test that uses sound waves (ultrasound) to produce images of  the heart. Images from an echocardiogram can provide important information about: Heart size and shape. The size and thickness and movement of your heart's walls. Heart muscle function and strength. Heart valve function or if you have stenosis. Stenosis is when the heart valves are too narrow. If blood is flowing backward through the heart valves (regurgitation). A tumor or infectious growth around the heart valves. Areas of heart muscle that are not working well because of poor blood flow or injury from a heart attack. Aneurysm detection. An aneurysm is a weak or damaged part of an artery wall. The wall bulges out from the normal force of blood pumping through the body. Tell a health care provider about: Any allergies you have. All medicines you are taking, including vitamins, herbs, eye drops, creams, and over-the-counter medicines. Any blood disorders you have. Any surgeries you have had. Any medical conditions you have. Whether you are pregnant or may be pregnant. What are the risks? Generally, this is a safe test. However, problems may occur, including an allergic reaction to dye (contrast) that may be used during the test. What happens before the test? No specific preparation is needed. You may eat and drink normally. What happens during the test? You will take off your clothes from the waist up and put on a hospital gown. Electrodes or electrocardiogram (ECG)patches may be placed on your chest. The electrodes or patches are then connected to a device that monitors your heart rate and rhythm. You will lie down on a table for an ultrasound exam. A gel will be applied to your chest to help sound waves pass through your skin. A handheld device, called a transducer, will be pressed against your chest and moved over your heart. The transducer produces sound waves that travel to your heart and bounce back (or "echo" back) to the transducer. These sound waves will be captured in real-time and  changed into images of your heart that can be viewed on a video monitor. The images will be recorded on a computer and reviewed by your health care provider. You may be asked to change positions or hold your breath for a short time. This makes it easier to get different views or better views of your heart. In some cases, you may receive contrast through an IV in one of your veins. This can improve the quality of the pictures from your heart. The procedure may vary among health care providers and hospitals.    What can I expect after the test? You may return to your normal,  everyday life, including diet, activities, and medicines, unless your health care provider tells you not to do that. Follow these instructions at home: It is up to you to get the results of your test. Ask your health care provider, or the department that is doing the test, when your results will be ready. Keep all follow-up visits. This is important. Summary An echocardiogram is a test that uses sound waves (ultrasound) to produce images of the heart. Images from an echocardiogram can provide important information about the size and shape of your heart, heart muscle function, heart valve function, and other possible heart problems. You do not need to do anything to prepare before this test. You may eat and drink normally. After the echocardiogram is completed, you may return to your normal, everyday life, unless your health care provider tells you not to do that. This information is not intended to replace advice given to you by your health care provider. Make sure you discuss any questions you have with your health care provider. Document Revised: 12/27/2019 Document Reviewed: 12/27/2019 Elsevier Patient Education  2021 [employer].

## 2022-02-07 NOTE — Progress Notes (Signed)
Cardiology Office Note:    Date:  02/07/2022   ID:  Kathy Christian, DOB Jun 29, 1970, MRN [id number]  PCP:  Nonnie Done., MD  Cardiologist:  Garwin Brothers, MD   Referring MD: Nonnie Done., MD    ASSESSMENT:    1. Chest pain of uncertain etiology   2. Obesity (BMI 35.0-39.9 without comorbidity)   3. Cigarette smoker   4. Cardiac murmur    PLAN:    In order of problems listed above:  Primary prevention stressed with the patient.  Importance of compliance with diet medication stressed and she vocalized understanding. Chest pain: Atypical in etiology but however in view of risk factors we will do an exercise stress Cardiolite and patient is agreeable. Cardiac murmur: Echocardiogram will be done to assess murmur heard on auscultation. Cigarette smoker: I spent 5 minutes with the patient discussing solely about smoking. Smoking cessation was counseled. I suggested to the patient also different medications and pharmacological interventions. Patient is keen to try stopping on its own at this time. He will get back to me if he needs any further assistance in this matter. Aortic atherosclerosis: I will get her back for blood work in the next few days to assess her lipids and start her on statin therapy. Obesity: Weight reduction was stressed and diet was emphasized.  Risks of obesity discussed with patient.  She promises to do better. Patient will be seen in follow-up appointment in 6 months or earlier if the patient has any concerns    Medication Adjustments/Labs and Tests Ordered: Current medicines are reviewed at length with the patient today.  Concerns regarding medicines are outlined above.  No orders of the defined types were placed in this encounter.  No orders of the defined types were placed in this encounter.    History of Present Illness:    Kathy Christian is a 51 y.o. female who is being seen today for the evaluation of chest pain and tightness at the request  of [place], Excell Seltzer., MD. patient is a pleasant 51 year old female.  She denies any history of hypertension or diabetes mellitus.  She is notable for cholesterol status.  Unfortunately she smokes since young age.  She tells me that she has significant anxiety issues and she lost her several months ago.  For this reason she has gained weight and increased her smoking.  No radiation of the chest tightness to the neck or to the arms.  She walked on the weekend for about 15 to 20 minutes without any symptoms.  At the time of my evaluation, the patient is alert awake oriented and in no distress.  Past Medical History:  Diagnosis Date   Anxiety     History reviewed. No pertinent surgical history.  Current Medications: Current Meds  Medication Sig   amitriptyline (ELAVIL) 100 MG tablet Take 100 mg by mouth at bedtime.   omeprazole (PRILOSEC) 40 MG capsule Take 40 mg by mouth daily.     Allergies:   Patient has no known allergies.   Social History   Socioeconomic History   Marital status: Married    Spouse name: Not on file   Number of children: Not on file   Years of education: Not on file   Highest education level: Not on file  Occupational History   Not on file  Tobacco Use   Smoking status: Every Day    Packs/day: 0.50    Years: 30.00    Total pack years: 15.00  Types: Cigarettes   Smokeless tobacco: Never  Vaping Use   Vaping Use: Never used  Substance and Sexual Activity   Alcohol use: No   Drug use: No   Sexual activity: Not on file  Other Topics Concern   Not on file  Social History Narrative   Not on file   Social Determinants of Health   Financial Resource Strain: Not on file  Food Insecurity: Not on file  Transportation Needs: Not on file  Physical Activity: Not on file  Stress: Not on file  Social Connections: Not on file     Family History: The patient's family history includes Hyperlipidemia in her mother.  ROS:   Please see the history of present  illness.    All other systems reviewed and are negative.  EKGs/Labs/Other Studies Reviewed:    The following studies were reviewed today: EKG reveals sinus rhythm poor anterior forces and nonspecific ST-T changes   Recent Labs: No results found for requested labs within last 365 days.  Recent Lipid Panel No results found for: "CHOL", "TRIG", "HDL", "CHOLHDL", "VLDL", "LDLCALC", "LDLDIRECT"  Physical Exam:    VS:  BP 128/82 (BP Location: Left Arm, Patient Position: Sitting, Cuff Size: Normal)   Pulse 92   Resp 18   Ht 5\' 1"  (1.549 m)   Wt 206 lb 3.2 oz (93.5 kg)   SpO2 99%   BMI 38.96 kg/m     Wt Readings from Last 3 Encounters:  02/07/22 206 lb 3.2 oz (93.5 kg)  03/16/17 174 lb 11.2 oz (79.2 kg)     GEN: Patient is in no acute distress HEENT: Normal NECK: No JVD; No carotid bruits LYMPHATICS: No lymphadenopathy CARDIAC: S1 S2 regular, 2/6 systolic murmur at the apex. RESPIRATORY:  Clear to auscultation without rales, wheezing or rhonchi  ABDOMEN: Soft, non-tender, non-distended MUSCULOSKELETAL:  No edema; No deformity  SKIN: Warm and dry NEUROLOGIC:  Alert and oriented x 3 PSYCHIATRIC:  Normal affect    Signed, Jenean Lindau, MD  02/07/2022 2:09 PM    [place]

## 2022-02-10 ENCOUNTER — Telehealth (HOSPITAL_COMMUNITY): Payer: Self-pay | Admitting: *Deleted

## 2022-02-10 NOTE — Telephone Encounter (Signed)
Patient given detailed instructions per Myocardial Perfusion Study Information Sheet for the test on  02/11/22 Patient notified to arrive 15 minutes early and that it is imperative to arrive on time for appointment to keep from having the test rescheduled.  If you need to cancel or reschedule your appointment, please call the office within 24 hours of your appointment. . Patient verbalized understanding. Kathy Christian   

## 2022-02-11 ENCOUNTER — Ambulatory Visit: Payer: PRIVATE HEALTH INSURANCE | Attending: Cardiology

## 2022-02-11 DIAGNOSIS — R079 Chest pain, unspecified: Secondary | ICD-10-CM | POA: Diagnosis not present

## 2022-02-11 LAB — MYOCARDIAL PERFUSION IMAGING
Exercise duration (min): 5 min
Exercise duration (sec): 6 s
LV sys vol: 9 mL
MPHR: 170 {beats}/min
Nuc Stress EF: 79 %
Peak HR: 162 {beats}/min
Rest Nuclear Isotope Dose: 10.6 mCi
SDS: 1
SSS: 1
TID: 0.87

## 2022-02-11 MED ORDER — TECHNETIUM TC 99M TETROFOSMIN IV KIT
29.7000 | PACK | Freq: Once | INTRAVENOUS | Status: AC | PRN
  Administered 2022-02-11: 29.7 via INTRAVENOUS

## 2022-02-11 MED ORDER — TECHNETIUM TC 99M TETROFOSMIN IV KIT
10.6000 | PACK | Freq: Once | INTRAVENOUS | Status: AC | PRN
  Administered 2022-02-11: 10.6 via INTRAVENOUS

## 2022-02-13 LAB — MYOCARDIAL PERFUSION IMAGING
Angina Index: 0
Duke Treadmill Score: 5
Estimated workload: 7
LV dias vol: 42 mL (ref 46–106)
Percent HR: 95 %
Rest HR: 99 {beats}/min
SRS: 0
ST Depression (mm): 0 mm
Stress Nuclear Isotope Dose: 29.7 mCi

## 2022-02-21 ENCOUNTER — Ambulatory Visit: Payer: PRIVATE HEALTH INSURANCE | Attending: Cardiology

## 2022-02-21 DIAGNOSIS — R011 Cardiac murmur, unspecified: Secondary | ICD-10-CM

## 2022-02-21 DIAGNOSIS — E785 Hyperlipidemia, unspecified: Secondary | ICD-10-CM

## 2022-02-21 DIAGNOSIS — R079 Chest pain, unspecified: Secondary | ICD-10-CM | POA: Diagnosis not present

## 2022-02-21 LAB — ECHOCARDIOGRAM COMPLETE
Area-P 1/2: 3.48 cm2
S' Lateral: 2.5 cm

## 2022-02-22 LAB — BASIC METABOLIC PANEL
BUN/Creatinine Ratio: 14 (ref 9–23)
BUN: 13 mg/dL (ref 6–24)
CO2: 21 mmol/L (ref 20–29)
Calcium: 9.7 mg/dL (ref 8.7–10.2)
Chloride: 103 mmol/L (ref 96–106)
Creatinine, Ser: 0.91 mg/dL (ref 0.57–1.00)
Glucose: 99 mg/dL (ref 70–99)
Potassium: 4.6 mmol/L (ref 3.5–5.2)
Sodium: 140 mmol/L (ref 134–144)
eGFR: 77 mL/min/{1.73_m2} (ref >59)

## 2022-02-22 LAB — HEPATIC FUNCTION PANEL
ALT: 24 IU/L (ref 0–32)
AST: 22 IU/L (ref 0–40)
Albumin: 4.5 g/dL (ref 3.9–4.9)
Alkaline Phosphatase: 75 IU/L (ref 44–121)
Bilirubin Total: 0.3 mg/dL (ref 0.0–1.2)
Bilirubin, Direct: 0.1 mg/dL (ref 0.00–0.40)
Total Protein: 6.6 g/dL (ref 6.0–8.5)

## 2022-02-22 LAB — LIPID PANEL
Chol/HDL Ratio: 4 ratio (ref 0.0–4.4)
Cholesterol, Total: 208 mg/dL — ABNORMAL HIGH (ref 100–199)
HDL: 52 mg/dL (ref >39)
LDL Chol Calc (NIH): 143 mg/dL — ABNORMAL HIGH (ref 0–99)
Triglycerides: 70 mg/dL (ref 0–149)
VLDL Cholesterol Cal: 13 mg/dL (ref 5–40)

## 2022-02-24 MED ORDER — ROSUVASTATIN CALCIUM 20 MG PO TABS: 20.0000 mg | ORAL_TABLET | Freq: Every day | ORAL | 3 refills | Status: DC

## 2022-04-09 ENCOUNTER — Other Ambulatory Visit: Payer: Self-pay | Admitting: Cardiology

## 2022-04-10 LAB — HEPATIC FUNCTION PANEL
ALT: 23 IU/L (ref 0–32)
AST: 17 IU/L (ref 0–40)
Albumin: 4.3 g/dL (ref 3.9–4.9)
Alkaline Phosphatase: 68 IU/L (ref 44–121)
Bilirubin Total: 0.3 mg/dL (ref 0.0–1.2)
Bilirubin, Direct: 0.11 mg/dL (ref 0.00–0.40)
Total Protein: 6.2 g/dL (ref 6.0–8.5)

## 2022-04-10 LAB — LIPID PANEL
Chol/HDL Ratio: 3 ratio (ref 0.0–4.4)
Cholesterol, Total: 150 mg/dL (ref 100–199)
HDL: 50 mg/dL (ref >39)
LDL Chol Calc (NIH): 84 mg/dL (ref 0–99)
Triglycerides: 82 mg/dL (ref 0–149)
VLDL Cholesterol Cal: 16 mg/dL (ref 5–40)

## 2022-05-09 ENCOUNTER — Ambulatory Visit: Payer: PRIVATE HEALTH INSURANCE | Admitting: Cardiology

## 2022-05-13 ENCOUNTER — Other Ambulatory Visit: Payer: Self-pay

## 2022-05-13 DIAGNOSIS — F419 Anxiety disorder, unspecified: Secondary | ICD-10-CM | POA: Insufficient documentation

## 2022-05-14 ENCOUNTER — Ambulatory Visit: Payer: PRIVATE HEALTH INSURANCE | Attending: Cardiology | Admitting: Cardiology

## 2022-05-14 ENCOUNTER — Encounter: Payer: Self-pay | Admitting: Cardiology

## 2022-05-14 VITALS — BP 138/68 | HR 114 | Ht 61.6 in | Wt 204.6 lb

## 2022-05-14 DIAGNOSIS — E669 Obesity, unspecified: Secondary | ICD-10-CM

## 2022-05-14 DIAGNOSIS — F17210 Nicotine dependence, cigarettes, uncomplicated: Secondary | ICD-10-CM | POA: Diagnosis not present

## 2022-05-14 DIAGNOSIS — R079 Chest pain, unspecified: Secondary | ICD-10-CM | POA: Diagnosis not present

## 2022-05-14 NOTE — Progress Notes (Signed)
Cardiology Office Note:    Date:  05/14/2022   ID:  Kathy Christian, DOB 24-Jun-1970, MRN [id number]  PCP:  Nonnie Done., MD  Cardiologist:  Garwin Brothers, MD   Referring MD: Nonnie Done., MD    ASSESSMENT:    1. Chest pain of uncertain etiology   2. Cigarette smoker   3. Obesity (BMI 35.0-39.9 without comorbidity)    PLAN:    In order of problems listed above:  Primary prevention stressed with the patient.  Importance of compliance with diet medication stressed and she vocalized understanding. Mixed dyslipidemia: On lipid-lowering medications.  Lipids were discussed with her and she is happy with that.  Diet emphasized. Obesity: Weight reduction stressed and she promises to do better.  Risks of obesity explained. Cigarette smoker: I spent 5 minutes with the patient discussing solely about smoking. Smoking cessation was counseled. I suggested to the patient also different medications and pharmacological interventions. Patient is keen to try stopping on its own at this time. He will get back to me if he needs any further assistance in this matter. Patient will be seen in follow-up appointment in 12 months or earlier if the patient has any concerns    Medication Adjustments/Labs and Tests Ordered: Current medicines are reviewed at length with the patient today.  Concerns regarding medicines are outlined above.  No orders of the defined types were placed in this encounter.  No orders of the defined types were placed in this encounter.    No chief complaint on file.    History of Present Illness:    Kathy Christian is a 51 y.o. female.  Patient has past medical history of mixed dyslipidemia and obesity.  She denies any problems at this time and takes care of activities of daily living.  No chest pain orthopnea or PND.  She is happy that her tests have come out fine.  She did well on the stress test and the echocardiogram.  She is doing her best to lose  weight.  Past Medical History:  Diagnosis Date   Anxiety    Cardiac murmur 02/07/2022   Chest pain of uncertain etiology 02/07/2022   Cigarette smoker 02/07/2022   Obesity (BMI 35.0-39.9 without comorbidity) 02/07/2022    Past Surgical History:  Procedure Laterality Date   CHOLECYSTECTOMY     ENDOMETRIAL ABLATION      Current Medications: Current Meds  Medication Sig   amitriptyline (ELAVIL) 100 MG tablet Take 100 mg by mouth at bedtime.   nitroGLYCERIN (NITROSTAT) 0.4 MG SL tablet Place 0.4 mg under the tongue every 5 (five) minutes as needed for chest pain.   omeprazole (PRILOSEC) 40 MG capsule Take 40 mg by mouth daily.   rosuvastatin (CRESTOR) 20 MG tablet Take 1 tablet (20 mg total) by mouth daily.     Allergies:   Patient has no known allergies.   Social History   Socioeconomic History   Marital status: Married    Spouse name: Not on file   Number of children: Not on file   Years of education: Not on file   Highest education level: Not on file  Occupational History   Not on file  Tobacco Use   Smoking status: Every Day    Packs/day: 0.50    Years: 30.00    Total pack years: 15.00    Types: Cigarettes   Smokeless tobacco: Never  Vaping Use   Vaping Use: Never used  Substance and Sexual Activity  Alcohol use: No   Drug use: No   Sexual activity: Not on file  Other Topics Concern   Not on file  Social History Narrative   Not on file   Social Determinants of Health   Financial Resource Strain: Not on file  Food Insecurity: Not on file  Transportation Needs: Not on file  Physical Activity: Not on file  Stress: Not on file  Social Connections: Not on file     Family History: The patient's family history includes Hyperlipidemia in her mother.  ROS:   Please see the history of present illness.    All other systems reviewed and are negative.  EKGs/Labs/Other Studies Reviewed:    The following studies were reviewed today: Stress test and  echocardiogram were unremarkable.   Recent Labs: 02/21/2022: BUN 13; Creatinine, Ser 0.91; Potassium 4.6; Sodium 140 04/09/2022: ALT 23  Recent Lipid Panel    Component Value Date/Time   CHOL 150 04/09/2022 0831   TRIG 82 04/09/2022 0831   HDL 50 04/09/2022 0831   CHOLHDL 3.0 04/09/2022 0831   LDLCALC 84 04/09/2022 0831    Physical Exam:    VS:  BP 138/68   Pulse (!) 114   Ht 5' 1.6" (1.565 m)   Wt 204 lb 9.6 oz (92.8 kg)   SpO2 99%   BMI 37.91 kg/m     Wt Readings from Last 3 Encounters:  05/14/22 204 lb 9.6 oz (92.8 kg)  02/07/22 206 lb 3.2 oz (93.5 kg)  03/16/17 174 lb 11.2 oz (79.2 kg)     GEN: Patient is in no acute distress HEENT: Normal NECK: No JVD; No carotid bruits LYMPHATICS: No lymphadenopathy CARDIAC: Hear sounds regular, 2/6 systolic murmur at the apex. RESPIRATORY:  Clear to auscultation without rales, wheezing or rhonchi  ABDOMEN: Soft, non-tender, non-distended MUSCULOSKELETAL:  No edema; No deformity  SKIN: Warm and dry NEUROLOGIC:  Alert and oriented x 3 PSYCHIATRIC:  Normal affect   Signed, Garwin Brothers, MD  05/14/2022 2:35 PM    [facility]

## 2022-05-14 NOTE — Patient Instructions (Signed)
Medication Instructions:  Your physician recommends that you continue on your current medications as directed. Please refer to the Current Medication list given to you today.  *If you need a refill on your cardiac medications before your next appointment, please call your pharmacy*   Lab Work: None ordered If you have labs (blood work) drawn today and your tests are completely normal, you will receive your results only by: MyChart Message (if you have MyChart) OR A paper copy in the mail If you have any lab test that is abnormal or we need to change your treatment, we will call you to review the results.   Testing/Procedures: None ordered   Follow-Up: At [facility], you and your health needs are our priority.  As part of our continuing mission to provide you with exceptional heart care, we have created designated Provider Care Teams.  These Care Teams include your primary Cardiologist (physician) and Advanced Practice Providers (APPs -  Physician Assistants and Nurse Practitioners) who all work together to provide you with the care you need, when you need it.  We recommend signing up for the patient portal called "MyChart".  Sign up information is provided on this After Visit Summary.  MyChart is used to connect with patients for Virtual Visits (Telemedicine).  Patients are able to view lab/test results, encounter notes, upcoming appointments, etc.  Non-urgent messages can be sent to your provider as well.   To learn more about what you can do with MyChart, go to [email or web address].    Your next appointment:   12 month(s)  The format for your next appointment:   In Person  Provider:   Rajan Revankar, MD    Other Instructions none  Important Information About Sugar      

## 2023-02-10 ENCOUNTER — Other Ambulatory Visit: Payer: Self-pay | Admitting: Cardiology

## 2023-02-10 DIAGNOSIS — E785 Hyperlipidemia, unspecified: Secondary | ICD-10-CM

## 2023-05-10 ENCOUNTER — Other Ambulatory Visit: Payer: Self-pay | Admitting: Cardiology

## 2023-05-10 DIAGNOSIS — E785 Hyperlipidemia, unspecified: Secondary | ICD-10-CM

## 2023-05-11 NOTE — Telephone Encounter (Signed)
Prescription sent to pharmacy.

## 2023-06-02 ENCOUNTER — Encounter: Payer: Self-pay | Admitting: Cardiology

## 2023-06-02 ENCOUNTER — Ambulatory Visit: Payer: PRIVATE HEALTH INSURANCE | Attending: Cardiology | Admitting: Cardiology

## 2023-06-02 VITALS — BP 144/84 | HR 107 | Ht 61.0 in | Wt 201.4 lb

## 2023-06-02 DIAGNOSIS — Z1321 Encounter for screening for nutritional disorder: Secondary | ICD-10-CM

## 2023-06-02 DIAGNOSIS — R030 Elevated blood-pressure reading, without diagnosis of hypertension: Secondary | ICD-10-CM | POA: Insufficient documentation

## 2023-06-02 DIAGNOSIS — Z131 Encounter for screening for diabetes mellitus: Secondary | ICD-10-CM

## 2023-06-02 DIAGNOSIS — E669 Obesity, unspecified: Secondary | ICD-10-CM

## 2023-06-02 DIAGNOSIS — Z1329 Encounter for screening for other suspected endocrine disorder: Secondary | ICD-10-CM

## 2023-06-02 DIAGNOSIS — E785 Hyperlipidemia, unspecified: Secondary | ICD-10-CM

## 2023-06-02 DIAGNOSIS — F17210 Nicotine dependence, cigarettes, uncomplicated: Secondary | ICD-10-CM

## 2023-06-02 HISTORY — DX: Elevated blood-pressure reading, without diagnosis of hypertension: R03.0

## 2023-06-02 NOTE — Progress Notes (Signed)
 Cardiology Office Note:    Date:  06/02/2023   ID:  Kathy Christian, DOB 01-06-1971, MRN [id number]  PCP:  Sabas Norleen PARAS., MD  Cardiologist:  Jennifer JONELLE Crape, MD   Referring MD: Sabas Norleen PARAS., MD    ASSESSMENT:    1. Hyperlipidemia, unspecified hyperlipidemia type   2. Obesity (BMI 35.0-39.9 without comorbidity)   3. Cigarette smoker   4. Elevated blood pressure reading in office with white coat syndrome, without diagnosis of hypertension    PLAN:    In order of problems listed above:  Primary prevention stressed with the patient.  Importance of compliance with diet medication stressed and patient verbalized standing. Mixed dyslipidemia: On lipid-lowering medications.  Lipids followed by primary care.  She wants to get blood work done here at this time.  Will do complete blood work including hemoglobin A1c.  She has history of vitamin D  deficiency and request blood work and we will oblige. Obesity: Weight reduction stressed diet emphasized and she promises to do better. Coronary risk stratification: For this I discussed calcium  scoring and she is agreeable. Cigarette smoker: I spent 5 minutes with the patient discussing solely about smoking. Smoking cessation was counseled. I suggested to the patient also different medications and pharmacological interventions. Patient is keen to try stopping on its own at this time. He will get back to me if he needs any further assistance in this matter. Patient will be seen in follow-up appointment in 6 months or earlier if the patient has any concerns.    Medication Adjustments/Labs and Tests Ordered: Current medicines are reviewed at length with the patient today.  Concerns regarding medicines are outlined above.  Orders Placed This Encounter  Procedures   EKG 12-Lead   No orders of the defined types were placed in this encounter.    No chief complaint on file.    History of Present Illness:    Kathy Christian is a 53 y.o.  female.  Patient has past medical history of mixed dyslipidemia, obesity and cigarette smoking.  She leads a sedentary lifestyle.  She denies any chest pain orthopnea or PND.  At the time of my evaluation, the patient is alert awake oriented and in no distress.  Past Medical History:  Diagnosis Date   Anxiety    Cardiac murmur 02/07/2022   Chest pain of uncertain etiology 02/07/2022   Cigarette smoker 02/07/2022   Obesity (BMI 35.0-39.9 without comorbidity) 02/07/2022    Past Surgical History:  Procedure Laterality Date   CHOLECYSTECTOMY     ENDOMETRIAL ABLATION      Current Medications: Current Meds  Medication Sig   nitroGLYCERIN  (NITROSTAT ) 0.4 MG SL tablet Place 0.4 mg under the tongue every 5 (five) minutes as needed for chest pain.   omeprazole (PRILOSEC) 40 MG capsule Take 40 mg by mouth daily.   rosuvastatin  (CRESTOR ) 20 MG tablet Take 1 tablet (20 mg total) by mouth daily. Patient needs to make 06/02/2023 appointment for further refills     Allergies:   Patient has no known allergies.   Social History   Socioeconomic History   Marital status: Married    Spouse name: Not on file   Number of children: Not on file   Years of education: Not on file   Highest education level: Not on file  Occupational History   Not on file  Tobacco Use   Smoking status: Every Day    Current packs/day: 0.50    Average packs/day: 0.5 packs/day for  30.0 years (15.0 ttl pk-yrs)    Types: Cigarettes   Smokeless tobacco: Never  Vaping Use   Vaping status: Never Used  Substance and Sexual Activity   Alcohol use: No   Drug use: No   Sexual activity: Not on file  Other Topics Concern   Not on file  Social History Narrative   Not on file   Social Drivers of Health   Financial Resource Strain: Not on file  Food Insecurity: Not on file  Transportation Needs: Not on file  Physical Activity: Not on file  Stress: Not on file  Social Connections: Not on file     Family History: The  patient's family history includes Hyperlipidemia in her mother.  ROS:   Please see the history of present illness.    All other systems reviewed and are negative.  EKGs/Labs/Other Studies Reviewed:    The following studies were reviewed today: .SABRAEKG Interpretation Date/Time:  Tuesday June 02 2023 13:58:46 EST Ventricular Rate:  107 PR Interval:  140 QRS Duration:  78 QT Interval:  328 QTC Calculation: 437 R Axis:   100  Text Interpretation: Sinus tachycardia Rightward axis ST & T wave abnormality, consider inferior ischemia Abnormal ECG When compared with ECG of 20-Jan-2002 20:42, Vent. rate has increased BY  38 BPM Nonspecific T wave abnormality now evident in Anterolateral leads Confirmed by Edwyna Backers [phone]) on 06/02/2023 2:15:07 PM     Recent Labs: No results found for requested labs within last 365 days.  Recent Lipid Panel    Component Value Date/Time   CHOL 150 04/09/2022 0831   TRIG 82 04/09/2022 0831   HDL 50 04/09/2022 0831   CHOLHDL 3.0 04/09/2022 0831   LDLCALC 84 04/09/2022 0831    Physical Exam:    VS:  BP (!) 144/84   Pulse (!) 107   Ht 5' 1 (1.549 m)   Wt 201 lb 6.4 oz (91.4 kg)   SpO2 98%   BMI 38.05 kg/m     Wt Readings from Last 3 Encounters:  06/02/23 201 lb 6.4 oz (91.4 kg)  05/14/22 204 lb 9.6 oz (92.8 kg)  02/07/22 206 lb 3.2 oz (93.5 kg)     GEN: Patient is in no acute distress HEENT: Normal NECK: No JVD; No carotid bruits LYMPHATICS: No lymphadenopathy CARDIAC: Hear sounds regular, 2/6 systolic murmur at the apex. RESPIRATORY:  Clear to auscultation without rales, wheezing or rhonchi  ABDOMEN: Soft, non-tender, non-distended MUSCULOSKELETAL:  No edema; No deformity  SKIN: Warm and dry NEUROLOGIC:  Alert and oriented x 3 PSYCHIATRIC:  Normal affect   Signed, Backers JONELLE Edwyna, MD  06/02/2023 2:24 PM    [facility]

## 2023-06-02 NOTE — Patient Instructions (Addendum)
 Medication Instructions:  Your physician recommends that you continue on your current medications as directed. Please refer to the Current Medication list given to you today.  *If you need a refill on your cardiac medications before your next appointment, please call your pharmacy*   Lab Work: Your physician recommends that you return for lab work in: CMP, CBC, TSH, vitamin D , A1C and lipids. You need to have labs done when you are fasting.  You can come Monday through Friday 8:30 am to 12:00 pm and 1:15 to 4:30. You do not need to make an appointment as the order has already been placed.   If you have labs (blood work) drawn today and your tests are completely normal, you will receive your results only by: MyChart Message (if you have MyChart) OR A paper copy in the mail If you have any lab test that is abnormal or we need to change your treatment, we will call you to review the results.   Testing/Procedures: We will order CT coronary calcium  score. It will cost $99.00 and is due at time of scan.  Please call to schedule.    [facility] [address] [place], [place] [address] Located on the first floor, [address] [phone]  Follow-Up: At [facility], you and your health needs are our priority.  As part of our continuing mission to provide you with exceptional heart care, we have created designated Provider Care Teams.  These Care Teams include your primary Cardiologist (physician) and Advanced Practice Providers (APPs -  Physician Assistants and Nurse Practitioners) who all work together to provide you with the care you need, when you need it.  We recommend signing up for the patient portal called MyChart.  Sign up information is provided on this After Visit Summary.  MyChart is used to connect with patients for Virtual Visits (Telemedicine).  Patients are able to view lab/test results, encounter notes, upcoming appointments, etc.  Non-urgent messages can  be sent to your provider as well.   To learn more about what you can do with MyChart, go to [email or web address].    Your next appointment:   12 month(s)  The format for your next appointment:   In Person  Provider:   Jennifer Crape, MD    Other Instructions none  Important Information About Sugar

## 2023-06-03 ENCOUNTER — Other Ambulatory Visit: Payer: Self-pay | Admitting: Cardiology

## 2023-06-03 ENCOUNTER — Telehealth (HOSPITAL_BASED_OUTPATIENT_CLINIC_OR_DEPARTMENT_OTHER): Payer: Self-pay

## 2023-06-03 DIAGNOSIS — E785 Hyperlipidemia, unspecified: Secondary | ICD-10-CM

## 2023-06-23 ENCOUNTER — Ambulatory Visit (HOSPITAL_BASED_OUTPATIENT_CLINIC_OR_DEPARTMENT_OTHER)
Admission: RE | Admit: 2023-06-23 | Discharge: 2023-06-23 | Disposition: A | Discharge status: Home / Self Care | Payer: Self-pay | Source: Ambulatory Visit | Attending: Cardiology | Admitting: Cardiology

## 2023-06-23 DIAGNOSIS — E785 Hyperlipidemia, unspecified: Secondary | ICD-10-CM | POA: Insufficient documentation

## 2023-07-04 LAB — COMPREHENSIVE METABOLIC PANEL
ALT: 18 [IU]/L (ref 0–32)
AST: 15 [IU]/L (ref 0–40)
Albumin: 4.7 g/dL (ref 3.8–4.9)
Alkaline Phosphatase: 72 [IU]/L (ref 44–121)
BUN/Creatinine Ratio: 15 (ref 9–23)
BUN: 13 mg/dL (ref 6–24)
Bilirubin Total: 0.6 mg/dL (ref 0.0–1.2)
CO2: 21 mmol/L (ref 20–29)
Calcium: 9.8 mg/dL (ref 8.7–10.2)
Chloride: 105 mmol/L (ref 96–106)
Creatinine, Ser: 0.89 mg/dL (ref 0.57–1.00)
Globulin, Total: 2 g/dL (ref 1.5–4.5)
Glucose: 91 mg/dL (ref 70–99)
Potassium: 4.4 mmol/L (ref 3.5–5.2)
Sodium: 142 mmol/L (ref 134–144)
Total Protein: 6.7 g/dL (ref 6.0–8.5)
eGFR: 78 mL/min/{1.73_m2} (ref >59)

## 2023-07-04 LAB — HEMOGLOBIN A1C
Est. average glucose Bld gHb Est-mCnc: 108 mg/dL
Hgb A1c MFr Bld: 5.4 % (ref 4.8–5.6)

## 2023-07-04 LAB — LIPID PANEL
Chol/HDL Ratio: 2.7 {ratio} (ref 0.0–4.4)
Cholesterol, Total: 117 mg/dL (ref 100–199)
HDL: 43 mg/dL (ref >39)
LDL Chol Calc (NIH): 59 mg/dL (ref 0–99)
Triglycerides: 76 mg/dL (ref 0–149)
VLDL Cholesterol Cal: 15 mg/dL (ref 5–40)

## 2023-07-04 LAB — CBC
Hematocrit: 44.1 % (ref 34.0–46.6)
Hemoglobin: 14.9 g/dL (ref 11.1–15.9)
MCH: 29.2 pg (ref 26.6–33.0)
MCHC: 33.8 g/dL (ref 31.5–35.7)
MCV: 86 fL (ref 79–97)
Platelets: 171 10*3/uL (ref 150–450)
RBC: 5.11 x10E6/uL (ref 3.77–5.28)
RDW: 13.7 % (ref 11.7–15.4)
WBC: 6.4 10*3/uL (ref 3.4–10.8)

## 2023-07-04 LAB — TSH: TSH: 1.96 u[IU]/mL (ref 0.450–4.500)

## 2023-07-04 LAB — VITAMIN D 25 HYDROXY (VIT D DEFICIENCY, FRACTURES): Vit D, 25-Hydroxy: 8.5 ng/mL — ABNORMAL LOW (ref 30.0–100.0)

## 2023-08-25 ENCOUNTER — Telehealth: Payer: Self-pay | Admitting: Cardiology

## 2023-08-25 DIAGNOSIS — R001 Bradycardia, unspecified: Secondary | ICD-10-CM

## 2023-08-25 NOTE — Telephone Encounter (Signed)
 Spoke with the patient who states that she noticed that her heart rate has been lower. Denies dizziness,lightlessness or syncope. Pt has lost 20 pounds, quit smoking and walks 1-3 miles 3-4 times a week. Please advise

## 2023-08-25 NOTE — Telephone Encounter (Signed)
 STAT if HR is under 50 or over 120 (normal HR is 60-100 beats per minute)  What is your heart rate? 53  Do you have a log of your heart rate readings (document readings)? [phone]  Do you have any other symptoms? no

## 2023-08-28 NOTE — Telephone Encounter (Signed)
 Pt stated she's been waiting for a callback but still hadn't heard anything on what to do next. Please advise.

## 2023-08-28 NOTE — Telephone Encounter (Signed)
 Spoke with the patient who states that she noticed that her heart rate has been lower. Denies dizziness,lightlessness or syncope. Pt has lost 20 pounds, quit smoking and walks 1-3 miles 3-4 times a week. Please advise

## 2023-09-04 ENCOUNTER — Ambulatory Visit: Payer: PRIVATE HEALTH INSURANCE | Attending: Cardiology

## 2023-09-04 DIAGNOSIS — R001 Bradycardia, unspecified: Secondary | ICD-10-CM

## 2023-09-04 NOTE — Telephone Encounter (Signed)
 Spoke with pt and she request monitor be mailed.

## 2023-10-19 DIAGNOSIS — R001 Bradycardia, unspecified: Secondary | ICD-10-CM

## 2023-10-21 ENCOUNTER — Ambulatory Visit: Payer: Self-pay | Admitting: Cardiology

## 2023-10-21 DIAGNOSIS — E785 Hyperlipidemia, unspecified: Secondary | ICD-10-CM

## 2023-10-29 NOTE — Telephone Encounter (Signed)
Viewed in MyChart Routed to PCP  

## 2023-11-12 MED ORDER — PITAVASTATIN CALCIUM 1 MG PO TABS: 1.0000 mg | ORAL_TABLET | Freq: Every day | ORAL | 3 refills | Status: DC

## 2023-11-12 NOTE — Addendum Note (Signed)
 Addended by: ONEITA BERLINER on: 11/12/2023 04:16 PM   Modules accepted: Orders

## 2023-12-22 ENCOUNTER — Encounter: Payer: Self-pay | Admitting: Cardiology

## 2023-12-30 LAB — HEPATIC FUNCTION PANEL
ALT: 13 IU/L (ref 0–32)
AST: 12 IU/L (ref 0–40)
Albumin: 4.4 g/dL (ref 3.8–4.9)
Alkaline Phosphatase: 78 IU/L (ref 44–121)
Bilirubin Total: 0.4 mg/dL (ref 0.0–1.2)
Bilirubin, Direct: 0.16 mg/dL (ref 0.00–0.40)
Total Protein: 6.6 g/dL (ref 6.0–8.5)

## 2023-12-30 LAB — LIPID PANEL
Chol/HDL Ratio: 3 ratio (ref 0.0–4.4)
Cholesterol, Total: 176 mg/dL (ref 100–199)
HDL: 58 mg/dL (ref >39)
LDL Chol Calc (NIH): 108 mg/dL — ABNORMAL HIGH (ref 0–99)
Triglycerides: 53 mg/dL (ref 0–149)
VLDL Cholesterol Cal: 10 mg/dL (ref 5–40)

## 2023-12-31 MED ORDER — PITAVASTATIN CALCIUM 2 MG PO TABS: 2.0000 mg | ORAL_TABLET | Freq: Every day | ORAL | 3 refills | Status: AC

## 2023-12-31 NOTE — Telephone Encounter (Signed)
-----   Message from Jennifer SAUNDERS Revankar sent at 12/30/2023 10:16 AM EDT ----- Double statin.  Diet.  Liver lipid in 6 weeks.  Copy primary Jennifer SAUNDERS Crape, MD 12/30/2023 10:16 AM  ----- Message ----- From: Rebecka Memos Lab Results In Sent: 12/30/2023   5:37 AM EDT To: Jennifer SAUNDERS Crape, MD

## 2023-12-31 NOTE — Telephone Encounter (Signed)
 MyChart message

## 2023-12-31 NOTE — Addendum Note (Signed)
 Addended by: ONEITA BERLINER on: 12/31/2023 08:20 AM   Modules accepted: Orders

## 2024-02-08 ENCOUNTER — Other Ambulatory Visit: Payer: Self-pay | Admitting: Cardiology

## 2024-02-09 LAB — HEPATIC FUNCTION PANEL
ALT: 13 IU/L (ref 0–32)
AST: 14 IU/L (ref 0–40)
Albumin: 4.3 g/dL (ref 3.8–4.9)
Alkaline Phosphatase: 73 IU/L (ref 49–135)
Bilirubin Total: 0.3 mg/dL (ref 0.0–1.2)
Bilirubin, Direct: 0.14 mg/dL (ref 0.00–0.40)
Total Protein: 6.5 g/dL (ref 6.0–8.5)

## 2024-02-09 LAB — LIPID PANEL
Chol/HDL Ratio: 2.8 ratio (ref 0.0–4.4)
Cholesterol, Total: 173 mg/dL (ref 100–199)
HDL: 62 mg/dL (ref >39)
LDL Chol Calc (NIH): 97 mg/dL (ref 0–99)
Triglycerides: 72 mg/dL (ref 0–149)
VLDL Cholesterol Cal: 14 mg/dL (ref 5–40)

## 2024-02-22 ENCOUNTER — Ambulatory Visit: Payer: Self-pay | Admitting: Cardiology

## 2024-06-06 ENCOUNTER — Ambulatory Visit: Payer: PRIVATE HEALTH INSURANCE | Admitting: Cardiology

## 2024-06-15 ENCOUNTER — Ambulatory Visit: Payer: PRIVATE HEALTH INSURANCE | Admitting: Cardiology

## 2024-06-21 ENCOUNTER — Ambulatory Visit: Payer: PRIVATE HEALTH INSURANCE | Admitting: Cardiology

## 2024-07-07 ENCOUNTER — Ambulatory Visit: Payer: PRIVATE HEALTH INSURANCE | Admitting: Cardiology
